# Patient Record
Sex: Female | Born: 1995 | Race: White | Hispanic: No | Marital: Single | Smoking: Never smoker
Health system: Southern US, Community
[De-identification: ages and names within clinical notes are randomized; demographics above are authoritative.]

---
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
## 2016-04-18 DIAGNOSIS — Z01419 Encounter for gynecological examination (general) (routine) without abnormal findings: Secondary | ICD-10-CM | POA: Diagnosis not present

## 2016-04-18 DIAGNOSIS — Z3040 Encounter for surveillance of contraceptives, unspecified: Secondary | ICD-10-CM | POA: Diagnosis not present

## 2016-04-18 DIAGNOSIS — Z113 Encounter for screening for infections with a predominantly sexual mode of transmission: Secondary | ICD-10-CM | POA: Diagnosis not present

## 2016-06-08 DIAGNOSIS — Z111 Encounter for screening for respiratory tuberculosis: Secondary | ICD-10-CM | POA: Diagnosis not present

## 2016-08-03 DIAGNOSIS — D225 Melanocytic nevi of trunk: Secondary | ICD-10-CM | POA: Diagnosis not present

## 2016-08-03 DIAGNOSIS — L219 Seborrheic dermatitis, unspecified: Secondary | ICD-10-CM | POA: Diagnosis not present

## 2016-09-15 DIAGNOSIS — F419 Anxiety disorder, unspecified: Secondary | ICD-10-CM | POA: Diagnosis not present

## 2016-09-15 DIAGNOSIS — Z6820 Body mass index (BMI) 20.0-20.9, adult: Secondary | ICD-10-CM | POA: Diagnosis not present

## 2017-01-17 DIAGNOSIS — Z6820 Body mass index (BMI) 20.0-20.9, adult: Secondary | ICD-10-CM | POA: Diagnosis not present

## 2017-01-17 DIAGNOSIS — R6889 Other general symptoms and signs: Secondary | ICD-10-CM | POA: Diagnosis not present

## 2017-03-23 DIAGNOSIS — Z23 Encounter for immunization: Secondary | ICD-10-CM | POA: Diagnosis not present

## 2017-03-23 DIAGNOSIS — Z0000 Encounter for general adult medical examination without abnormal findings: Secondary | ICD-10-CM | POA: Diagnosis not present

## 2017-03-23 DIAGNOSIS — L219 Seborrheic dermatitis, unspecified: Secondary | ICD-10-CM | POA: Diagnosis not present

## 2017-05-03 DIAGNOSIS — Z01419 Encounter for gynecological examination (general) (routine) without abnormal findings: Secondary | ICD-10-CM | POA: Diagnosis not present

## 2017-05-03 DIAGNOSIS — Z6822 Body mass index (BMI) 22.0-22.9, adult: Secondary | ICD-10-CM | POA: Diagnosis not present

## 2017-05-16 DIAGNOSIS — L700 Acne vulgaris: Secondary | ICD-10-CM | POA: Diagnosis not present

## 2017-06-26 ENCOUNTER — Ambulatory Visit (HOSPITAL_COMMUNITY)
Admission: EM | Admit: 2017-06-26 | Discharge: 2017-06-26 | Disposition: A | Discharge status: Home / Self Care | Payer: BLUE CROSS/BLUE SHIELD | Attending: Internal Medicine | Admitting: Internal Medicine

## 2017-06-26 ENCOUNTER — Encounter (HOSPITAL_COMMUNITY): Payer: Self-pay | Admitting: Emergency Medicine

## 2017-06-26 DIAGNOSIS — R300 Dysuria: Secondary | ICD-10-CM | POA: Diagnosis not present

## 2017-06-26 DIAGNOSIS — B9689 Other specified bacterial agents as the cause of diseases classified elsewhere: Secondary | ICD-10-CM

## 2017-06-26 DIAGNOSIS — N760 Acute vaginitis: Secondary | ICD-10-CM | POA: Diagnosis not present

## 2017-06-26 DIAGNOSIS — N898 Other specified noninflammatory disorders of vagina: Secondary | ICD-10-CM | POA: Diagnosis not present

## 2017-06-26 DIAGNOSIS — B373 Candidiasis of vulva and vagina: Secondary | ICD-10-CM | POA: Diagnosis not present

## 2017-06-26 DIAGNOSIS — Z3202 Encounter for pregnancy test, result negative: Secondary | ICD-10-CM | POA: Diagnosis not present

## 2017-06-26 DIAGNOSIS — B3731 Acute candidiasis of vulva and vagina: Secondary | ICD-10-CM

## 2017-06-26 LAB — POCT URINALYSIS DIP (DEVICE)
BILIRUBIN URINE: NEGATIVE
Glucose, UA: NEGATIVE mg/dL
HGB URINE DIPSTICK: NEGATIVE
Ketones, ur: NEGATIVE mg/dL
LEUKOCYTES UA: NEGATIVE
Nitrite: NEGATIVE
Protein, ur: NEGATIVE mg/dL
SPECIFIC GRAVITY, URINE: 1.025 (ref 1.005–1.030)
Urobilinogen, UA: 0.2 mg/dL (ref 0.0–1.0)
pH: 6 (ref 5.0–8.0)

## 2017-06-26 LAB — POCT PREGNANCY, URINE: Preg Test, Ur: NEGATIVE

## 2017-06-26 MED ORDER — METRONIDAZOLE 500 MG PO TABS: 500.0000 mg | ORAL_TABLET | Freq: Two times a day (BID) | ORAL | 0 refills | Status: AC

## 2017-06-26 MED ORDER — FLUCONAZOLE 150 MG PO TABS: ORAL_TABLET | ORAL | 0 refills | Status: AC

## 2017-06-26 NOTE — ED Triage Notes (Signed)
Pt here for white vag d/c onset 10 days associated w/itching, burning when voiding urine, nausea  Denies abd pain, fever, vomiting   Sexually active in a monogamous relationship x4 years... No STD concerns.   A&O x4... NAD... Ambulatory

## 2017-06-26 NOTE — Discharge Instructions (Signed)
Take the medication as directed. Fluid samples have been obtained. For any other positives we will call you and likely be able to treat over the telephone.

## 2017-06-26 NOTE — ED Provider Notes (Signed)
CSN: [id number]     Arrival date & time 06/26/17  1702 History   None    Chief Complaint  Patient presents with  . Vaginal Discharge   (Consider location/radiation/quality/duration/timing/severity/associated sxs/prior Treatment) 21 year old female complaining of vaginal discharge with occasional burning and irritation to the wall of a which is on and off and dysuria. LMP 2-3 weeks ago, right on time none mist.      History reviewed. No pertinent past medical history. History reviewed. No pertinent surgical history. History reviewed. No pertinent family history. Social History  Substance Use Topics  . Smoking status: Never Smoker  . Smokeless tobacco: Never Used  . Alcohol use Yes   OB History    No data available     Review of Systems  Constitutional: Negative.   Respiratory: Negative.   Genitourinary: Positive for vaginal discharge. Negative for difficulty urinating, frequency and pelvic pain.  Neurological: Negative.   All other systems reviewed and are negative.   Allergies  Amoxicillin  Home Medications   Prior to Admission medications   Medication Sig Start Date End Date Taking? Authorizing Provider  norgestimate-ethinyl estradiol (ORTHO-CYCLEN,SPRINTEC,PREVIFEM) 0.25-35 MG-MCG tablet Take 1 tablet by mouth daily.   Yes [provider]  fluconazole (DIFLUCAN) 150 MG tablet 1 tab po x 1. May repeat in 72 hours if no improvement 06/26/17   Hayden Rasmussen, NP  metroNIDAZOLE (FLAGYL) 500 MG tablet Take 1 tablet (500 mg total) by mouth 2 (two) times daily. X 7 days 06/26/17   Hayden Rasmussen, NP   Meds Ordered and Administered this Visit  Medications - No data to display  BP 114/70 (BP Location: Right Arm)   Pulse 65   Temp 98.8 F (37.1 C) (Oral)   Resp 16   LMP 06/12/2017   SpO2 97%  No data found.   Physical Exam  Constitutional: She is oriented to person, place, and time. She appears well-developed and well-nourished. No distress.  Eyes: EOM are  normal.  Neck: Normal range of motion. Neck supple.  Cardiovascular: Normal rate.   Pulmonary/Chest: Effort normal. No respiratory distress.  Genitourinary:  Genitourinary Comments: Normal external female genitalia. The mucosa aspect of the labia minora is mildly erythematous and tender. No external discharge or lesions. There is a moderate amount of creamy white discharge along the vaginal walls and coating the cervix. The cervix is posterior and left of midline. There is erythema surrounding the cervical os. No CMT or adnexal tenderness.  Musculoskeletal: She exhibits no edema.  Neurological: She is alert and oriented to person, place, and time. She exhibits normal muscle tone.  Skin: Skin is warm and dry.  Psychiatric: She has a normal mood and affect.  Nursing note and vitals reviewed.   Urgent Care Course     Procedures (including critical care time)  Labs Review Labs Reviewed  POCT URINALYSIS DIP (DEVICE)  POCT PREGNANCY, URINE  CERVICOVAGINAL ANCILLARY ONLY    Imaging Review No results found.   Visual Acuity Review  Right Eye Distance:   Left Eye Distance:   Bilateral Distance:    Right Eye Near:   Left Eye Near:    Bilateral Near:         MDM   1. BV (bacterial vaginosis)   2. Vaginal yeast infection    Take the medication as directed. Fluid samples have been obtained. For any other positives we will call you and likely be able to treat over the telephone. Meds ordered this encounter  Medications  .  norgestimate-ethinyl estradiol (ORTHO-CYCLEN,SPRINTEC,PREVIFEM) 0.25-35 MG-MCG tablet    Sig: Take 1 tablet by mouth daily.  . fluconazole (DIFLUCAN) 150 MG tablet    Sig: 1 tab po x 1. May repeat in 72 hours if no improvement    Dispense:  2 tablet    Refill:  0    Order Specific Question:   Supervising Provider    Answer:   Francesca OmanMURRAY, ALEXANDER [phone][[id number]]  . metroNIDAZOLE (FLAGYL) 500 MG tablet    Sig: Take 1 tablet (500 mg total) by mouth 2 (two) times  daily. X 7 days    Dispense:  14 tablet    Refill:  0    Order Specific Question:   Supervising Provider    Answer:   Francesca OmanMURRAY, ALEXANDER [[id number]]       Hayden RasmussenMabe, Chae Shuster, NP 06/26/17 1906

## 2017-06-27 LAB — CERVICOVAGINAL ANCILLARY ONLY
BACTERIAL VAGINITIS: NEGATIVE
Candida vaginitis: NEGATIVE
Chlamydia: NEGATIVE
Neisseria Gonorrhea: NEGATIVE
Trichomonas: NEGATIVE

## 2017-07-18 DIAGNOSIS — Z23 Encounter for immunization: Secondary | ICD-10-CM | POA: Diagnosis not present

## 2017-08-08 DIAGNOSIS — L700 Acne vulgaris: Secondary | ICD-10-CM | POA: Diagnosis not present

## 2017-09-18 DIAGNOSIS — L700 Acne vulgaris: Secondary | ICD-10-CM | POA: Diagnosis not present

## 2017-09-19 DIAGNOSIS — L700 Acne vulgaris: Secondary | ICD-10-CM | POA: Diagnosis not present

## 2017-10-17 DIAGNOSIS — L700 Acne vulgaris: Secondary | ICD-10-CM | POA: Diagnosis not present

## 2017-11-20 DIAGNOSIS — L700 Acne vulgaris: Secondary | ICD-10-CM | POA: Diagnosis not present

## 2017-12-25 DIAGNOSIS — L700 Acne vulgaris: Secondary | ICD-10-CM | POA: Diagnosis not present

## 2018-01-25 DIAGNOSIS — L700 Acne vulgaris: Secondary | ICD-10-CM | POA: Diagnosis not present

## 2018-02-28 DIAGNOSIS — L700 Acne vulgaris: Secondary | ICD-10-CM | POA: Diagnosis not present

## 2018-04-03 DIAGNOSIS — L700 Acne vulgaris: Secondary | ICD-10-CM | POA: Diagnosis not present

## 2018-04-03 DIAGNOSIS — D225 Melanocytic nevi of trunk: Secondary | ICD-10-CM | POA: Diagnosis not present

## 2018-04-19 ENCOUNTER — Ambulatory Visit: Payer: BLUE CROSS/BLUE SHIELD | Admitting: Podiatry

## 2018-05-04 DIAGNOSIS — L700 Acne vulgaris: Secondary | ICD-10-CM | POA: Diagnosis not present
